# Patient Record
Sex: Male | Born: 1980 | Race: White | Hispanic: No | Marital: Married
Health system: Southern US, Community
[De-identification: ages and names within clinical notes are randomized; demographics above are authoritative.]

---

## 2011-06-20 ENCOUNTER — Emergency Department: Payer: Self-pay | Admitting: Emergency Medicine

## 2013-05-16 IMAGING — CT CT HEAD WITHOUT CONTRAST
2 series · 16 of 30 positions shown, 20 images · non-contrast
Comparison: none

REASON FOR EXAM: head trauma, lac, vomiting, ha
COMMENTS:

PROCEDURE:     CT  - CT HEAD WITHOUT CONTRAST  - June 20, 2011  [DATE]
RESULT:     Comparison:  None
TECHNIQUE: Multiple axial images from the foramen magnum to the vertex were
obtained without IV contrast.

[Series 2: without · axial · non-contrast · 0.41mm/px · z∈[-182,-62]mm · 13 of 30 slices shown, 17 images]
[im 3/30  brain]
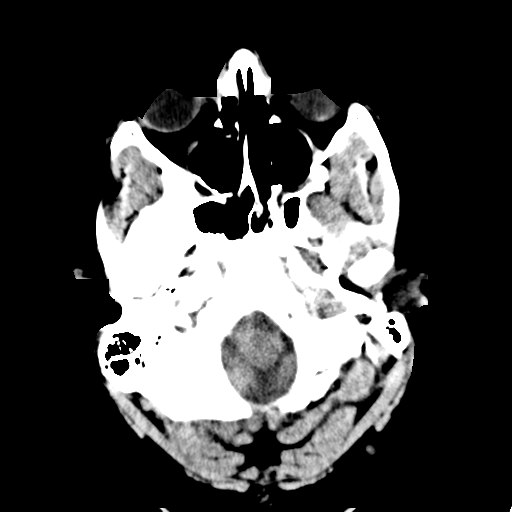
[im 3/30  bone]
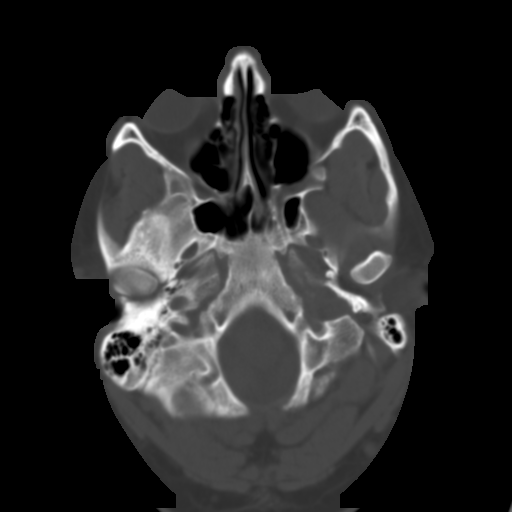
[im 5/30  brain]
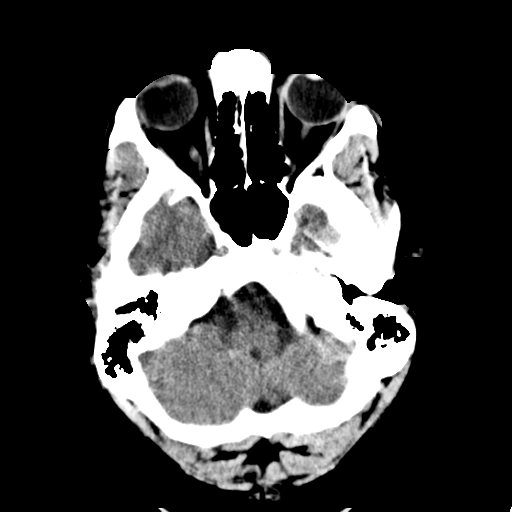
[im 7/30  brain]
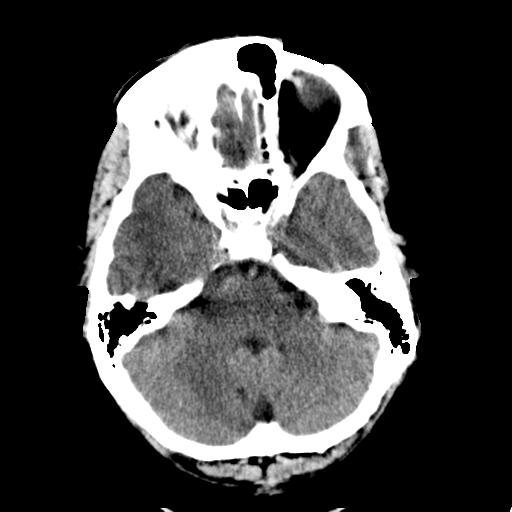
[im 9/30  brain]
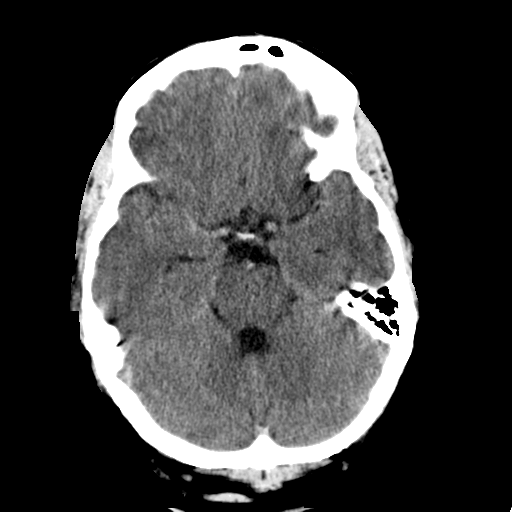
[im 11/30  brain]
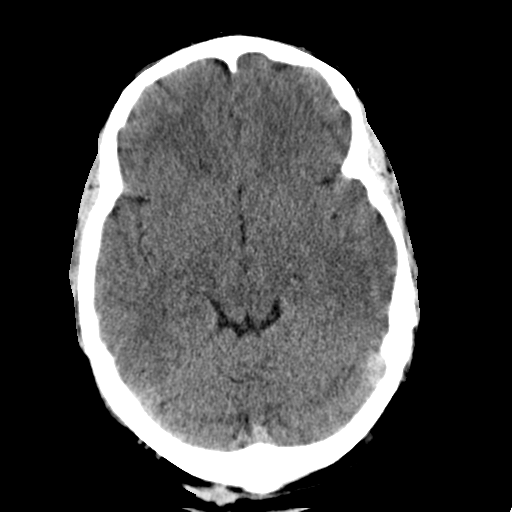
[im 11/30  bone]
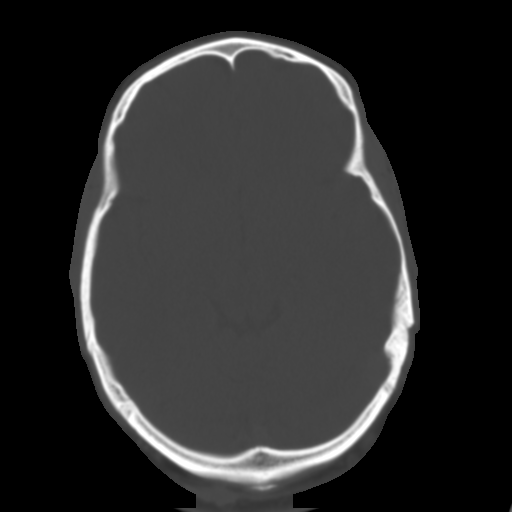
[im 13/30  brain]
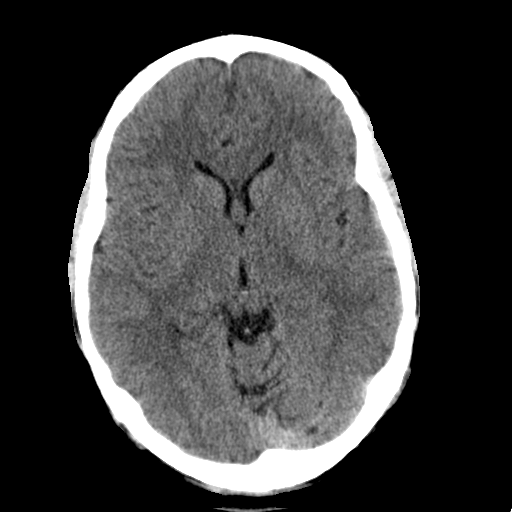
[im 15/30  brain]
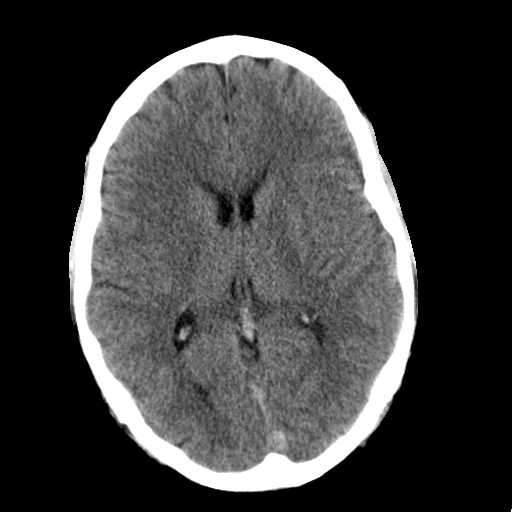
[im 17/30  brain]
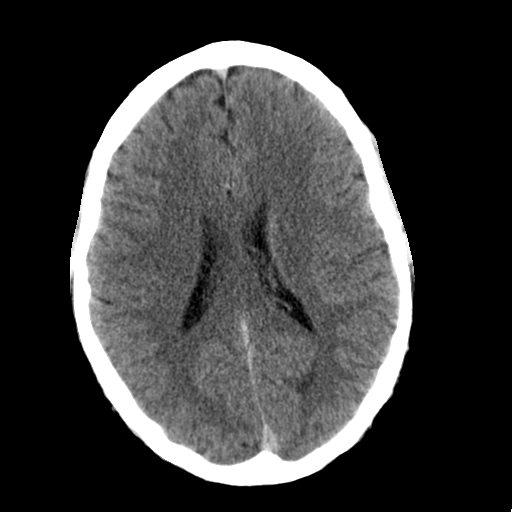
[im 19/30  brain]
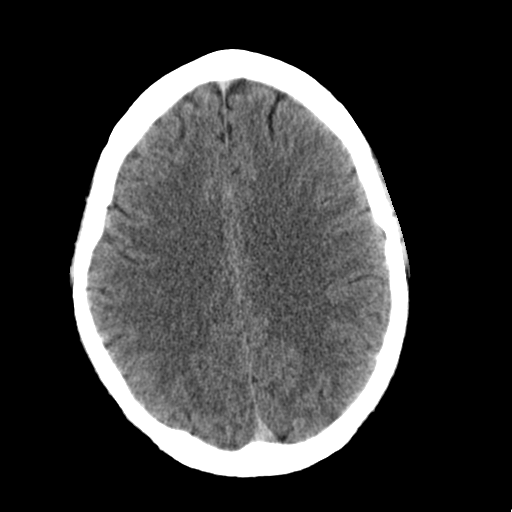
[im 19/30  bone]
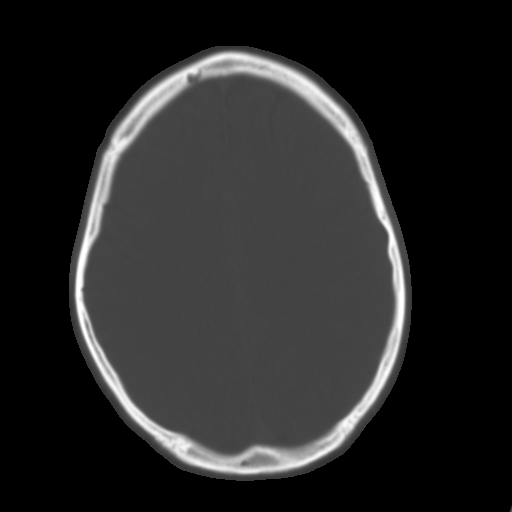
[im 21/30  brain]
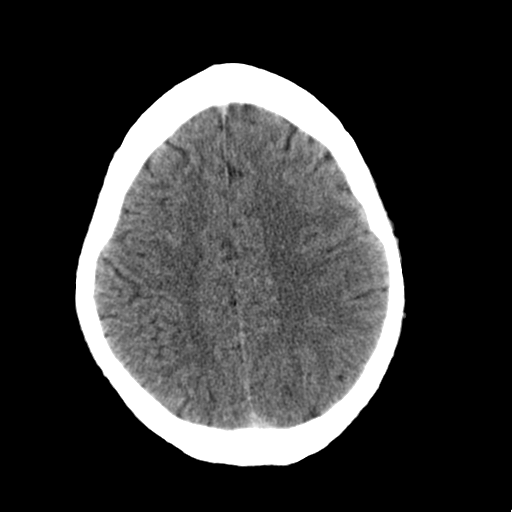
[im 23/30  brain]
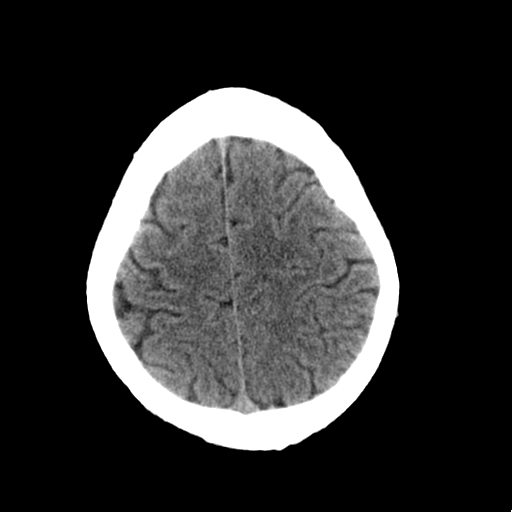
[im 25/30  brain]
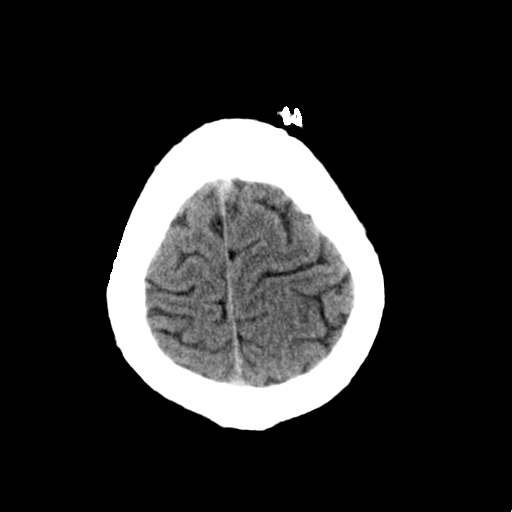
[im 27/30  brain]
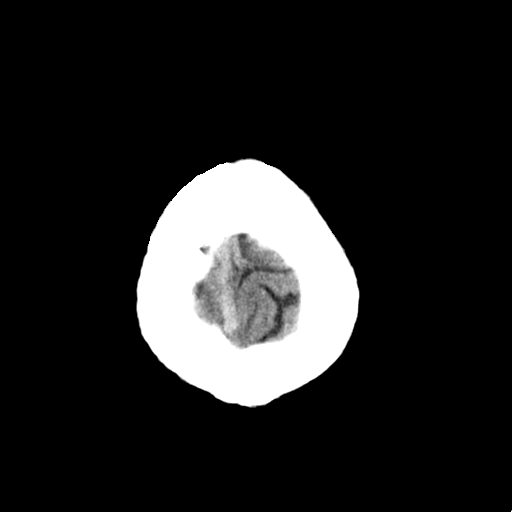
[im 27/30  bone]
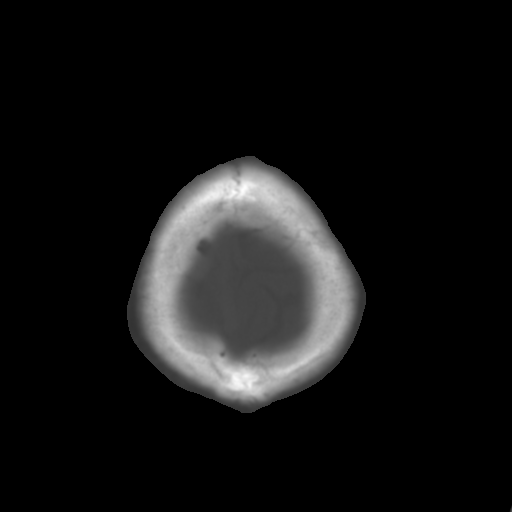

[Series 3: bone · axial · 0.41mm/px · z∈[-182,-142]mm · 3 of 30 slices shown]
[im 3/30  bone]
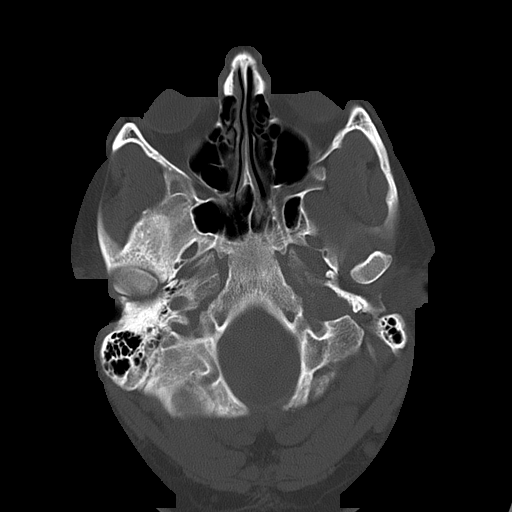
[im 7/30  bone]
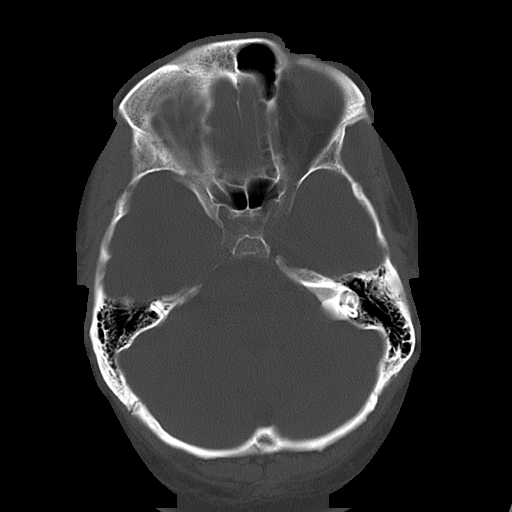
[im 11/30  bone]
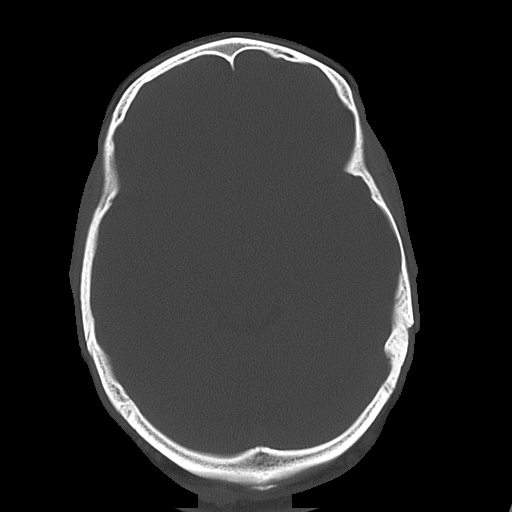

[16 of 30 positions shown; findings below may reference images not displayed]

FINDINGS: There is no evidence of mass effect, midline shift, or extra-axial fluid
collections.  There is no evidence of a space-occupying lesion or
intracranial hemorrhage. There is no evidence of a cortical-based area of
acute infarction.

The ventricles and sulci are appropriate for the patient's age. The basal
cisterns are patent.

Visualized portions of the orbits are unremarkable. The visualized portions
of the paranasal sinuses and mastoid air cells are unremarkable.

The osseous structures are unremarkable.
IMPRESSION: No acute intracranial process.

[REDACTED]

## 2022-04-01 ENCOUNTER — Other Ambulatory Visit: Payer: Self-pay

## 2022-04-01 ENCOUNTER — Emergency Department
Admission: EM | Admit: 2022-04-01 | Discharge: 2022-04-01 | Disposition: A | Discharge status: Home / Self Care | Payer: Federal, State, Local not specified - PPO | Attending: Emergency Medicine | Admitting: Emergency Medicine

## 2022-04-01 DIAGNOSIS — Z23 Encounter for immunization: Secondary | ICD-10-CM | POA: Diagnosis not present

## 2022-04-01 DIAGNOSIS — W260XXA Contact with knife, initial encounter: Secondary | ICD-10-CM | POA: Diagnosis not present

## 2022-04-01 DIAGNOSIS — S61412A Laceration without foreign body of left hand, initial encounter: Secondary | ICD-10-CM | POA: Diagnosis present

## 2022-04-01 MED ORDER — CEPHALEXIN 500 MG PO CAPS: 500.0000 mg | ORAL_CAPSULE | Freq: Three times a day (TID) | ORAL | 0 refills | Status: AC

## 2022-04-01 MED ORDER — TETANUS-DIPHTH-ACELL PERTUSSIS 5-2.5-18.5 LF-MCG/0.5 IM SUSY
0.5000 mL | PREFILLED_SYRINGE | Freq: Once | INTRAMUSCULAR | Status: AC
  Administered 2022-04-01: 0.5 mL via INTRAMUSCULAR
  Filled 2022-04-01: qty 0.5

## 2022-04-01 MED ORDER — LIDOCAINE HCL (PF) 1 % IJ SOLN
5.0000 mL | Freq: Once | INTRAMUSCULAR | Status: AC
  Administered 2022-04-01: 5 mL
  Filled 2022-04-01: qty 5

## 2022-04-01 NOTE — ED Triage Notes (Signed)
While separating some frozen burger patties with a knife, punctured left hand between index and third finger with knife.  Bleeding controlled.  DSD applied.

## 2022-04-01 NOTE — ED Provider Notes (Signed)
[facility] Emergency Department Provider Note     Event Date/Time   First MD Initiated Contact with Patient 04/01/22 1139     (approximate)   History   Extremity Laceration   HPI  Ryan Stein is a 42 y.o. male presents to the ED for evaluation of accidental laceration to the left arm.  Patient was using a knife to separate some frozen burger patties, when he accidentally stabbed himself in the second interspace.  He presents with wound to the palm as well as a wound to the dorsum of the hand consistent with a through and through puncture.  Bleeding is currently controlled.  Patient with normal flexion and extension of the hand.   Physical Exam   Triage Vital Signs: ED Triage Vitals  Enc Vitals Group     BP 04/01/22 1121 (!) 136/90     Pulse Rate 04/01/22 1121 79     Resp 04/01/22 1121 16     Temp 04/01/22 1121 97.8 F (36.6 C)     Temp Source 04/01/22 1121 Oral     SpO2 04/01/22 1121 97 %     Weight 04/01/22 1120 200 lb (90.7 kg)     Height 04/01/22 1120 5\' 8"  (1.727 m)     Head Circumference --      Peak Flow --      Pain Score 04/01/22 1120 0     Pain Loc --      Pain Edu? --      Excl. in [place]? --     Most recent vital signs: Vitals:   04/01/22 1121  BP: (!) 136/90  Pulse: 79  Resp: 16  Temp: 97.8 F (36.6 C)  SpO2: 97%    General Awake, no distress. NAD CV:  Good peripheral perfusion.  RESP:  Normal effort.  ABD:  No distention.  MSK:  Normal composite fist.  Normal flexion and extension range of the left index and middle fingers.  No active bleeding noted. NEURO: Normal gross sensation.  Cranial nerves II to XII grossly intact.   ED Results / Procedures / Treatments   Labs (all labs ordered are listed, but only abnormal results are displayed) Labs Reviewed - No data to display   EKG   RADIOLOGY  No results found.   PROCEDURES:  Critical Care performed: No  ..Laceration Repair  Date/Time: 04/01/2022  12:39 PM  Performed by: Melvenia Needles, PA-C Authorized by: Melvenia Needles, PA-C   Consent:    Consent obtained:  Verbal   Consent given by:  Patient   Risks, benefits, and alternatives were discussed: yes     Risks discussed:  Pain and poor wound healing   Alternatives discussed:  No treatment Universal protocol:    Site/side marked: yes     Patient identity confirmed:  Verbally with patient Anesthesia:    Anesthesia method:  Local infiltration   Local anesthetic:  Lidocaine 1% w/o epi Laceration details:    Location:  Hand   Hand location:  L palm   Length (cm):  1   Depth (mm):  5 Pre-procedure details:    Preparation:  Patient was prepped and draped in usual sterile fashion Exploration:    Limited defect created (wound extended): no     Hemostasis achieved with:  Direct pressure   Contaminated: no   Treatment:    Area cleansed with:  Saline   Amount of cleaning:  Standard   Irrigation solution:  Sterile saline   Irrigation volume:  10   Irrigation method:  Syringe   Debridement:  None   Undermining:  None   Scar revision: no   Skin repair:    Repair method:  Sutures   Suture size:  4-0   Suture material:  Nylon   Suture technique:  Simple interrupted   Number of sutures:  2 Approximation:    Approximation:  Close Repair type:    Repair type:  Simple Post-procedure details:    Dressing:  Non-adherent dressing   Procedure completion:  Tolerated well, no immediate complications    MEDICATIONS ORDERED IN ED: Medications  lidocaine (PF) (XYLOCAINE) 1 % injection 5 mL (5 mLs Infiltration Given by Other 04/01/22 1203)  Tdap (BOOSTRIX) injection 0.5 mL (0.5 mLs Intramuscular Given 04/01/22 1203)     IMPRESSION / MDM / ASSESSMENT AND PLAN / ED COURSE  I reviewed the triage vital signs and the nursing notes.                              Differential diagnosis includes, but is not limited to, puncture wound, laceration, tendon injury, retained  foreign body, laceration  Patient's presentation is most consistent with acute, uncomplicated illness.  Patient's diagnosis is consistent with laceration. Patient will be discharged home with prescriptions for Keflex as well as wound care instructions. Patient is to follow up with local urgent care for suture removal in 7 to 10 days as needed or otherwise directed. Patient is given ED precautions to return to the ED for any worsening or new symptoms.     FINAL CLINICAL IMPRESSION(S) / ED DIAGNOSES   Final diagnoses:  Laceration of left hand without foreign body, initial encounter     Rx / DC Orders   ED Discharge Orders          Ordered    cephALEXin (KEFLEX) 500 MG capsule  3 times daily        04/01/22 1247             Note:  This document was prepared using Dragon voice recognition software and may include unintentional dictation errors.    Melvenia Needles, PA-C 04/01/22 1302    Lavonia Drafts, MD 04/01/22 1318

## 2022-04-01 NOTE — Discharge Instructions (Addendum)
Keep the wound clean, dry, and covered.  Take antibiotic as directed.  See your primary provider for suture removal in 10-12 days.
